# Patient Record
Sex: Male | Born: 1990 | Race: Black or African American | Hispanic: No | Marital: Single | Smoking: Never smoker
Health system: Southern US, Community
[De-identification: ages and names within clinical notes are randomized; demographics above are authoritative.]

## PROBLEM LIST (undated history)

## (undated) DIAGNOSIS — T7840XA Allergy, unspecified, initial encounter: Secondary | ICD-10-CM

## (undated) HISTORY — DX: Allergy, unspecified, initial encounter: T78.40XA

---
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
## 2015-05-27 ENCOUNTER — Ambulatory Visit (INDEPENDENT_AMBULATORY_CARE_PROVIDER_SITE_OTHER): Payer: No Typology Code available for payment source | Admitting: Emergency Medicine

## 2015-05-27 VITALS — BP 124/86 | HR 70 | Temp 98.5°F | Resp 16 | Ht 74.5 in | Wt 258.0 lb

## 2015-05-27 DIAGNOSIS — Z0000 Encounter for general adult medical examination without abnormal findings: Secondary | ICD-10-CM

## 2015-05-27 NOTE — Progress Notes (Addendum)
   Subjective:  This chart was scribed for Lesle ChrisSteven Braya Habermehl MD,  by Veverly FellsHatice Demirci,scribe, at [facility].  This patient was seen in room 5 and the patient's care was started at 11:38 AM.   Chief Complaint  Patient presents with  . Annual Exam     Patient ID: Colin Rivera, male    DOB: November 28, 1991, 24 y.o.   MRN: [id number]  HPI  HPI Comments: Colin Rivera is a 24 y.o. 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  Patient has no history of past surgeries. He does not take any medications besides Claritin for his allergies. He played football at [employer].  He has no other concerns today.     Past Medical History  Diagnosis Date  . Allergy     No current outpatient prescriptions on file prior to visit.   No current facility-administered medications on file prior to visit.    No Known Allergies  No results found for this or any previous visit (from the past 2160 hour(s)).      Review of Systems  Constitutional: Negative for fever and chills.  Respiratory: Negative for cough and shortness of breath.   Gastrointestinal: Negative for nausea and vomiting.  Musculoskeletal: Negative for back pain and neck pain.       Objective:   Physical Exam CONSTITUTIONAL: Well developed/well nourished HEAD: Normocephalic/atraumatic EYES: EOMI/PERRL ENMT: Mucous membranes moist NECK: supple no meningeal signs SPINE/BACK:entire spine nontender CV: S1/S2 noted, no murmurs/rubs/gallops noted LUNGS: Lungs are clear to auscultation bilaterally, no apparent distress ABDOMEN: soft, nontender, no rebound or guarding, bowel sounds noted throughout abdomen GU:no cva tenderness NEURO: Pt is awake/alert/appropriate, moves all extremitiesx4.  No facial droop.   EXTREMITIES: pulses normal/equal, full ROM SKIN: warm, color normal PSYCH: no abnormalities of mood noted, alert and oriented to  situation GENITAL EXAM: 1 by 1.5 cm mole superior to the penis.   Filed Vitals:   05/27/15 1112  BP: 124/86  Pulse: 70  Temp: 98.5 F (36.9 C)  Resp: 16  Height: 6' 2.5" (1.892 m)  Weight: 258 lb (117.028 kg)  SpO2: 98%          Assessment & Plan:  Patient came in today for his examination prior to performing the [employer] fitness evaluation. He played football for 4 years at [employer]. He maintains a healthy lifestyle.I personally performed the services described in this documentation, which was scribed in my presence. The recorded information has been reviewed and is accurate.  Earl LitesSteve Orange Hilligoss, MD

## 2015-05-27 NOTE — Patient Instructions (Signed)
